# Patient Record
Sex: Female | Born: 1990 | Race: Black or African American | Hispanic: No | Marital: Single
Health system: Southern US, Community
[De-identification: ages and names within clinical notes are randomized; demographics above are authoritative.]

## PROBLEM LIST (undated history)

## (undated) DIAGNOSIS — Z803 Family history of malignant neoplasm of breast: Secondary | ICD-10-CM

## (undated) HISTORY — DX: Family history of malignant neoplasm of breast: Z80.3

---

## 2018-02-09 DIAGNOSIS — Z01419 Encounter for gynecological examination (general) (routine) without abnormal findings: Secondary | ICD-10-CM | POA: Diagnosis not present

## 2018-02-10 ENCOUNTER — Other Ambulatory Visit: Payer: Self-pay | Admitting: Family

## 2018-02-10 DIAGNOSIS — Z803 Family history of malignant neoplasm of breast: Secondary | ICD-10-CM

## 2018-02-12 ENCOUNTER — Encounter: Payer: Self-pay | Admitting: Genetic Counselor

## 2018-02-12 ENCOUNTER — Telehealth: Payer: Self-pay | Admitting: Genetic Counselor

## 2018-02-12 NOTE — Telephone Encounter (Signed)
Received a call from Germaine PomfretSabrina Scroggins at [facility] to schedule a genetics appt. Pt has been scheduled to see Maylon CosKaren Powell on 10/16 at 2pm. Martie LeeSabrina will notify the pt. Letter mailed.

## 2018-02-24 DIAGNOSIS — F439 Reaction to severe stress, unspecified: Secondary | ICD-10-CM | POA: Diagnosis not present

## 2018-03-10 ENCOUNTER — Encounter: Payer: Self-pay | Admitting: Genetic Counselor

## 2018-03-10 ENCOUNTER — Inpatient Hospital Stay: Payer: BLUE CROSS/BLUE SHIELD | Attending: Genetic Counselor | Admitting: Genetic Counselor

## 2018-03-10 ENCOUNTER — Inpatient Hospital Stay: Payer: BLUE CROSS/BLUE SHIELD

## 2018-03-10 DIAGNOSIS — Z803 Family history of malignant neoplasm of breast: Secondary | ICD-10-CM

## 2018-03-10 DIAGNOSIS — Z808 Family history of malignant neoplasm of other organs or systems: Secondary | ICD-10-CM | POA: Diagnosis not present

## 2018-03-10 NOTE — Progress Notes (Signed)
REFERRING PROVIDER: Willey Blade, MD [place], [place] [address]  PRIMARY PROVIDER:  Patient, No Pcp Per  PRIMARY REASON FOR VISIT:  1. Family history of breast cancer   2. Family history of bone cancer      HISTORY OF PRESENT ILLNESS:   Kimberly Sweeney, a 27 y.o. female, was seen for a [place] cancer genetics consultation at the request of Dr. Karlton Lemon due to a personal and family history of cancer.  Kimberly Sweeney presents to clinic today to discuss the possibility of a hereditary predisposition to cancer, genetic testing, and to further clarify her future cancer risks, as well as potential cancer risks for family members.   Kimberly Sweeney is a 27 y.o. female with no personal history of cancer.  She has a family history of breast cancer and was referred for genetic testing.  The patient reports that her mother and maternal aunt both had testing and she thinks both had negative genetic test results.  CANCER HISTORY:   No history exists.     HORMONAL RISK FACTORS:  Menarche was at age 76-12.  First live birth at age N/A.  OCP use for approximately 0 years.  Ovaries intact: yes.  Hysterectomy: no.  Menopausal status: premenopausal.  HRT use: 0 years. Colonoscopy: no; not examined. Mammogram within the last year: upcoming mammogram 03/12/2018. Number of breast biopsies: 0. Up to date with pelvic exams:  yes. Any excessive radiation exposure in the past:  no  Past Medical History:  Diagnosis Date  . Family history of breast cancer     Social History   Socioeconomic History  . Marital status: Single    Spouse name: Not on file  . Number of children: Not on file  . Years of education: Not on file  . Highest education level: Not on file  Occupational History  . Not on file  Social Needs  . Financial resource strain: Not on file  . Food insecurity:    Worry: Not on file    Inability: Not on file  . Transportation needs:    Medical: Not on file   Non-medical: Not on file  Tobacco Use  . Smoking status: Not on file  Substance and Sexual Activity  . Alcohol use: Not on file  . Drug use: Not on file  . Sexual activity: Not on file  Lifestyle  . Physical activity:    Days per week: Not on file    Minutes per session: Not on file  . Stress: Not on file  Relationships  . Social connections:    Talks on phone: Not on file    Gets together: Not on file    Attends religious service: Not on file    Active member of club or organization: Not on file    Attends meetings of clubs or organizations: Not on file    Relationship status: Not on file  Other Topics Concern  . Not on file  Social History Narrative  . Not on file     FAMILY HISTORY:  We obtained a detailed, 4-generation family history.  Significant diagnoses are listed below: Family History  Problem Relation Age of Onset  . Breast cancer Mother 3       neg genetic testing after 2013  . Breast cancer Maternal Grandmother        dx in her 84s  . Bone cancer Maternal Grandfather   . Kidney failure Maternal Grandfather   . Heart disease Paternal Grandmother   .  Breast cancer Maternal Aunt        dx in her 31s  . Sickle cell anemia Maternal Uncle     The patient does not have children.  She has two paternal half sisters and a maternal half sister.  All are cancer free.  The patient's parents are both living.  The patient's mother had breast cancer at age 32.  She had genetic testing since 2013 which is reportedly negative.  She had two sisters and three brothers.  One sister had breast cancer in her 75's and also had reportedly negative genetic testing.  One brother died in his 71's from sickle cell anemia.  The maternal grandfather died of bone cancer and the grandmother is living in her 52's.  She had breast cancer in her 13's.  The patient's father had 12 siblings, none were reported to have cancer.  The paternal grandparents are deceased from non cancer related  issues.  Kimberly Sweeney is aware of previous family history of genetic testing for hereditary cancer risks. Patient's maternal ancestors are of African American descent, and paternal ancestors are of African American descent. There is no reported Ashkenazi Jewish ancestry. There is no known consanguinity.  GENETIC COUNSELING ASSESSMENT: Kimberly Sweeney is a 27 y.o. female with a family history of breast cancer which is somewhat suggestive of a hereditary breast cancer syndrome and predisposition to cancer. We, therefore, discussed and recommended the following at today's visit.   DISCUSSION: We discussed that about 5-10% of breast cancer is hereditary with most cases due to BRCA mutations.  There are other genes that can be associated with hereditary breast cancer syndromes including ATM, CHEK2 and PALB2.  Depending on when her mother was tested and what kind of testing she had would determine whether her mother would need to be tested again.  For instance if her mother was only tested for BRCA mutations, but did not have a panel that looked at other hereditary genes, some of her risk could have been missed by the limitations of testing at that time.    We reviewed the characteristics, features and inheritance patterns of hereditary cancer syndromes. We also discussed genetic testing, including the appropriate family members to test, the process of testing, insurance coverage and turn-around-time for results. We discussed the implications of a negative, positive and/or variant of uncertain significant result. We recommended Kimberly Sweeney pursue genetic testing for the common hereditary gene panel. The Hereditary Gene Panel offered by [facility] includes sequencing and/or deletion duplication testing of the following 47 genes: APC, ATM, AXIN2, BARD1, BMPR1A, BRCA1, BRCA2, BRIP1, CDH1, CDK4, CDKN2A (p14ARF), CDKN2A (p16INK4a), CHEK2, CTNNA1, DICER1, EPCAM (Deletion/duplication testing only), GREM1 (promoter region  deletion/duplication testing only), KIT, MEN1, MLH1, MSH2, MSH3, MSH6, MUTYH, NBN, NF1, NHTL1, PALB2, PDGFRA, PMS2, POLD1, POLE, PTEN, RAD50, RAD51C, RAD51D, SDHB, SDHC, SDHD, SMAD4, SMARCA4. STK11, TP53, TSC1, TSC2, and VHL.  The following genes were evaluated for sequence changes only: SDHA and HOXB13 c.251G>A variant only.   Based on Kimberly Sweeney's personal and family history of cancer, she meets medical criteria for genetic testing. Despite that she meets criteria, she may still have an out of pocket cost. We discussed that if her out of pocket cost for testing is over $100, the laboratory will call and confirm whether she wants to proceed with testing.  If the out of pocket cost of testing is less than $100 she will be billed by the genetic testing laboratory.   Based on the patient's personal and family history,  statistical models (Tyrer Cusik)  and literature data were used to estimate her risk of developing breast cancer. These estimate her lifetime risk of developing breast cancer to be approximately 41.9%. This estimation does not take into account any genetic testing results.  The patient's lifetime breast cancer risk is a preliminary estimate based on available information using one of several models endorsed by the [place] (ACS). The ACS recommends consideration of breast MRI screening as an adjunct to mammography for patients at high risk (defined as 20% or greater lifetime risk). A more detailed breast cancer risk assessment can be considered, if clinically indicated.   Kimberly Sweeney has been determined to be at high risk for breast cancer.  Therefore, we recommend that annual screening with mammography and breast MRI begin at age 25, or 10 years prior to the age of breast cancer diagnosis in a relative (whichever is earlier).  We discussed that Kimberly Sweeney should discuss her individual situation with her referring physician and determine a breast cancer screening plan with which they  are both comfortable.     PLAN: After considering the risks, benefits, and limitations, Kimberly Sweeney  provided informed consent to pursue genetic testing and the blood sample was sent to [facility] for analysis of the common hereditary cancer panel. Results should be available within approximately 2-3 weeks' time, at which point they will be disclosed by telephone to Kimberly Sweeney, as will any additional recommendations warranted by these results. Kimberly Sweeney will receive a summary of her genetic counseling visit and a copy of her results once available. This information will also be available in Epic. We encouraged Kimberly Sweeney to remain in contact with cancer genetics annually so that we can continuously update the family history and inform her of any changes in cancer genetics and testing that may be of benefit for her family. Kimberly Sweeney questions were answered to her satisfaction today. Our contact information was provided should additional questions or concerns arise.  Lastly, we encouraged Kimberly Sweeney to remain in contact with cancer genetics annually so that we can continuously update the family history and inform her of any changes in cancer genetics and testing that may be of benefit for this family.   Ms.  Sweeney questions were answered to her satisfaction today. Our contact information was provided should additional questions or concerns arise. Thank you for the referral and allowing [place] to share in the care of your patient.   Kimberly Sweeney, [place], [facility] Certified Genetic Counselor [email or web address] phone: [phone]  The patient was seen for a total of 45 minutes in face-to-face genetic counseling.  This patient was discussed with Drs. Magrinat, Lindi Adie and/or Burr Medico who agrees with the above.    _______________________________________________________________________ For Office Staff:  Number of people involved in session: 1 Was an Intern/ student involved with case:  no

## 2018-03-12 ENCOUNTER — Ambulatory Visit
Admission: RE | Admit: 2018-03-12 | Discharge: 2018-03-12 | Disposition: A | Discharge status: Home / Self Care | Payer: BLUE CROSS/BLUE SHIELD | Source: Ambulatory Visit | Attending: Family | Admitting: Family

## 2018-03-12 ENCOUNTER — Other Ambulatory Visit: Payer: Self-pay | Admitting: Family

## 2018-03-12 DIAGNOSIS — Z1231 Encounter for screening mammogram for malignant neoplasm of breast: Secondary | ICD-10-CM | POA: Diagnosis not present

## 2018-03-12 DIAGNOSIS — Z803 Family history of malignant neoplasm of breast: Secondary | ICD-10-CM

## 2018-03-16 ENCOUNTER — Other Ambulatory Visit: Payer: Self-pay | Admitting: Family

## 2018-03-16 DIAGNOSIS — R928 Other abnormal and inconclusive findings on diagnostic imaging of breast: Secondary | ICD-10-CM

## 2018-03-18 ENCOUNTER — Other Ambulatory Visit: Payer: Self-pay | Admitting: Family

## 2018-03-18 ENCOUNTER — Ambulatory Visit
Admission: RE | Admit: 2018-03-18 | Discharge: 2018-03-18 | Disposition: A | Discharge status: Home / Self Care | Payer: BLUE CROSS/BLUE SHIELD | Source: Ambulatory Visit | Attending: Family | Admitting: Family

## 2018-03-18 DIAGNOSIS — N6489 Other specified disorders of breast: Secondary | ICD-10-CM | POA: Diagnosis not present

## 2018-03-18 DIAGNOSIS — Z809 Family history of malignant neoplasm, unspecified: Secondary | ICD-10-CM | POA: Diagnosis not present

## 2018-03-18 DIAGNOSIS — R922 Inconclusive mammogram: Secondary | ICD-10-CM | POA: Diagnosis not present

## 2018-03-18 DIAGNOSIS — R928 Other abnormal and inconclusive findings on diagnostic imaging of breast: Secondary | ICD-10-CM

## 2018-03-18 DIAGNOSIS — N63 Unspecified lump in unspecified breast: Secondary | ICD-10-CM

## 2018-03-25 ENCOUNTER — Telehealth: Payer: Self-pay | Admitting: Genetic Counselor

## 2018-03-25 ENCOUNTER — Ambulatory Visit: Payer: Self-pay | Admitting: Genetic Counselor

## 2018-03-25 ENCOUNTER — Encounter: Payer: Self-pay | Admitting: Genetic Counselor

## 2018-03-25 DIAGNOSIS — Z1379 Encounter for other screening for genetic and chromosomal anomalies: Secondary | ICD-10-CM | POA: Insufficient documentation

## 2018-03-25 NOTE — Progress Notes (Addendum)
HPI:  Kimberly Sweeney was previously seen in the [place] clinic due to a family history of cancer and concerns regarding a hereditary predisposition to cancer. Please refer to our prior cancer genetics clinic note for more information regarding Kimberly Sweeney's medical, social and family histories, and our assessment and recommendations, at the time. Kimberly Sweeney recent genetic test results were disclosed to her, as were recommendations warranted by these results. These results and recommendations are discussed in more detail below.  CANCER HISTORY:   No history exists.    FAMILY HISTORY:  We obtained a detailed, 4-generation family history.  Significant diagnoses are listed below: Family History  Problem Relation Age of Onset  . Breast cancer Mother 53       neg genetic testing after 2013  . Breast cancer Maternal Grandmother        dx in her 40s  . Bone cancer Maternal Grandfather   . Kidney failure Maternal Grandfather   . Heart disease Paternal Grandmother   . Breast cancer Maternal Aunt        dx in her 55s  . Sickle cell anemia Maternal Uncle     The patient does not have children.  She has two paternal half sisters and a maternal half sister.  All are cancer free.  The patient's parents are both living.  The patient's mother had breast cancer at age 71.  She had genetic testing since 2013 which is reportedly negative.  She had two sisters and three brothers.  One sister had breast cancer in her 16's and also had reportedly negative genetic testing.  One brother died in his 51's from sickle cell anemia.  The maternal grandfather died of bone cancer and the grandmother is living in her 24's.  She had breast cancer in her 56's.  The patient's father had 12 siblings, none were reported to have cancer.  The paternal grandparents are deceased from non cancer related issues.  Kimberly Sweeney is aware of previous family history of genetic testing for hereditary cancer risks.  Patient's maternal ancestors are of African American descent, and paternal ancestors are of African American descent. There is no reported Ashkenazi Jewish ancestry. There is no known consanguinity.  GENETIC TEST RESULTS: Genetic testing reported out on March 23, 2018 through the common hereditary cancer panel found no deleterious mutations.  The Hereditary Gene Panel offered by [facility] includes sequencing and/or deletion duplication testing of the following 47 genes: APC, ATM, AXIN2, BARD1, BMPR1A, BRCA1, BRCA2, BRIP1, CDH1, CDK4, CDKN2A (p14ARF), CDKN2A (p16INK4a), CHEK2, CTNNA1, DICER1, EPCAM (Deletion/duplication testing only), GREM1 (promoter region deletion/duplication testing only), KIT, MEN1, MLH1, MSH2, MSH3, MSH6, MUTYH, NBN, NF1, NHTL1, PALB2, PDGFRA, PMS2, POLD1, POLE, PTEN, RAD50, RAD51C, RAD51D, SDHB, SDHC, SDHD, SMAD4, SMARCA4. STK11, TP53, TSC1, TSC2, and VHL.  The following genes were evaluated for sequence changes only: SDHA and HOXB13 c.251G>A variant only.  The test report has been scanned into EPIC and is located under the Molecular Pathology section of the Results Review tab.    We discussed with Kimberly Sweeney that since the current genetic testing is not perfect, it is possible there may be a gene mutation in one of these genes that current testing cannot detect, but that chance is small.  We also discussed, that it is possible that another gene that has not yet been discovered, or that we have not yet tested, is responsible for the cancer diagnoses in the family, and it is, therefore, important to remain in touch  with cancer genetics in the future so that we can continue to offer Kimberly Sweeney the most up to date genetic testing.   CANCER SCREENING RECOMMENDATIONS: Given Kimberly Sweeney's personal and family histories, we must interpret these negative results with some caution.  Families with features suggestive of hereditary risk for cancer tend to have multiple family members with cancer,  diagnoses in multiple generations and diagnoses before the age of 28. Kimberly Sweeney family exhibits some of these features. Thus this result may simply reflect our current inability to detect all mutations within these genes or there may be a different gene that has not yet been discovered or tested.   An individual's cancer risk and medical management are not determined by genetic test results alone. Overall cancer risk assessment incorporates additional factors, including personal medical history, family history, and any available genetic information that may result in a personalized plan for cancer prevention and surveillance.  Based on Kimberly Sweeney's personal and family history of cancer, as well as her genetic test results, statistical models Harriett Sweeney)  and literature data were used to estimate her risk of developing breast cancer. These estimate her lifetime risk of developing breast cancer to be approximately 35%.  The patient's lifetime breast cancer risk is a preliminary estimate based on available information using one of several models endorsed by the [place] (ACS). The ACS recommends consideration of breast MRI screening as an adjunct to mammography for patients at high risk (defined as 20% or greater lifetime risk). A more detailed breast cancer risk assessment can be considered, if clinically indicated.   Kimberly Sweeney has been determined to be at high risk for breast cancer.  Therefore, we recommend that annual screening with mammography and breast MRI begin at age 26, or 10 years prior to the age of breast cancer diagnosis in a relative (whichever is earlier).  We discussed that Kimberly Sweeney should discuss her individual situation with her referring physician and determine a breast cancer screening plan with which they are both comfortable.    We, therefore, discussed that it is reasonable for Kimberly Sweeney to be followed by a high-risk breast cancer clinic; in addition to a yearly  mammogram and physical exam by a healthcare provider, she should discuss the usefulness of an annual breast MRI with the high-risk clinic providers. She will be referred to the [facility] high risk breast clinic.  RECOMMENDATIONS FOR FAMILY MEMBERS:  Women in this family might be at some increased risk of developing cancer, over the general population risk, simply due to the family history of cancer.  We recommended women in this family have a yearly mammogram beginning at age 62, or 43 years younger than the earliest onset of cancer, an annual clinical breast exam, and perform monthly breast self-exams. Women in this family should also have a gynecological exam as recommended by their primary provider. All family members should have a colonoscopy by age 60.  Based on Kimberly Sweeney's family history, we recommended her mother, who was diagnosed with breast cancer at age 2, have genetic counseling and possible updated testing. Kimberly Sweeney will let us know if we can be of any assistance in coordinating genetic counseling and/or testing for this family member.   FOLLOW-UP: Lastly, we discussed with Kimberly Sweeney that cancer genetics is a rapidly advancing field and it is possible that new genetic tests will be appropriate for her and/or her family members in the future. We encouraged her to remain in contact with cancer  genetics on an annual basis so we can update her personal and family histories and let her know of advances in cancer genetics that may benefit this family.   Our contact number was provided. Kimberly Sweeney questions were answered to her satisfaction, and she knows she is welcome to call us at anytime with additional questions or concerns.   Roma Kayser, MS, [facility] Certified Genetic Counselor [email or web address]

## 2018-03-25 NOTE — Telephone Encounter (Signed)
Revealed negative genetic testing.  Discussed that we do not know why there is cancer in the family. It could be due to a different gene that we are not testing, or maybe our current technology may not be able to pick something up.  It will be important for her to keep in contact with genetics to keep up with whether additional testing may be needed.  pateint is at high risk for breast cancer.  TC gives 35% risk.  Referred to high risk breast clinic for follow up.

## 2018-04-01 DIAGNOSIS — M25531 Pain in right wrist: Secondary | ICD-10-CM | POA: Diagnosis not present

## 2018-04-15 ENCOUNTER — Telehealth: Payer: Self-pay | Admitting: Oncology

## 2018-04-15 NOTE — Telephone Encounter (Signed)
Lft the pt a vm to schedule an appt for the high risk clinic

## 2018-04-29 ENCOUNTER — Other Ambulatory Visit: Payer: Self-pay | Admitting: Family

## 2018-07-08 DIAGNOSIS — F439 Reaction to severe stress, unspecified: Secondary | ICD-10-CM | POA: Diagnosis not present

## 2018-08-05 DIAGNOSIS — J00 Acute nasopharyngitis [common cold]: Secondary | ICD-10-CM | POA: Diagnosis not present

## 2018-09-20 ENCOUNTER — Other Ambulatory Visit: Payer: Self-pay

## 2018-09-20 ENCOUNTER — Ambulatory Visit
Admission: RE | Admit: 2018-09-20 | Discharge: 2018-09-20 | Disposition: A | Discharge status: Home / Self Care | Payer: BLUE CROSS/BLUE SHIELD | Source: Ambulatory Visit | Attending: Family | Admitting: Family

## 2018-09-20 ENCOUNTER — Other Ambulatory Visit: Payer: Self-pay | Admitting: Family

## 2018-09-20 DIAGNOSIS — N63 Unspecified lump in unspecified breast: Secondary | ICD-10-CM | POA: Diagnosis not present

## 2018-09-20 DIAGNOSIS — N6489 Other specified disorders of breast: Secondary | ICD-10-CM | POA: Diagnosis not present

## 2019-02-18 DIAGNOSIS — Z1159 Encounter for screening for other viral diseases: Secondary | ICD-10-CM | POA: Diagnosis not present

## 2019-02-18 DIAGNOSIS — Z20828 Contact with and (suspected) exposure to other viral communicable diseases: Secondary | ICD-10-CM | POA: Diagnosis not present

## 2019-02-18 DIAGNOSIS — U071 COVID-19: Secondary | ICD-10-CM | POA: Diagnosis not present

## 2019-02-20 DIAGNOSIS — J111 Influenza due to unidentified influenza virus with other respiratory manifestations: Secondary | ICD-10-CM | POA: Diagnosis not present

## 2019-02-20 DIAGNOSIS — U071 COVID-19: Secondary | ICD-10-CM | POA: Diagnosis not present

## 2019-03-07 DIAGNOSIS — Z1159 Encounter for screening for other viral diseases: Secondary | ICD-10-CM | POA: Diagnosis not present

## 2019-03-14 ENCOUNTER — Ambulatory Visit
Admission: RE | Admit: 2019-03-14 | Discharge: 2019-03-14 | Disposition: A | Discharge status: Home / Self Care | Payer: BLUE CROSS/BLUE SHIELD | Source: Ambulatory Visit | Attending: Family | Admitting: Family

## 2019-03-14 ENCOUNTER — Other Ambulatory Visit: Payer: Self-pay

## 2019-03-14 ENCOUNTER — Ambulatory Visit
Admission: RE | Admit: 2019-03-14 | Discharge: 2019-03-14 | Disposition: A | Discharge status: Home / Self Care | Payer: BC Managed Care – PPO | Source: Ambulatory Visit | Attending: Family | Admitting: Family

## 2019-03-14 DIAGNOSIS — R922 Inconclusive mammogram: Secondary | ICD-10-CM | POA: Diagnosis not present

## 2019-03-14 DIAGNOSIS — N63 Unspecified lump in unspecified breast: Secondary | ICD-10-CM

## 2019-03-22 ENCOUNTER — Other Ambulatory Visit: Payer: BLUE CROSS/BLUE SHIELD

## 2019-04-18 DIAGNOSIS — Z113 Encounter for screening for infections with a predominantly sexual mode of transmission: Secondary | ICD-10-CM | POA: Diagnosis not present

## 2019-04-18 DIAGNOSIS — N760 Acute vaginitis: Secondary | ICD-10-CM | POA: Diagnosis not present

## 2019-04-18 DIAGNOSIS — Z20828 Contact with and (suspected) exposure to other viral communicable diseases: Secondary | ICD-10-CM | POA: Diagnosis not present

## 2019-04-18 DIAGNOSIS — B373 Candidiasis of vulva and vagina: Secondary | ICD-10-CM | POA: Diagnosis not present

## 2019-06-22 DIAGNOSIS — Z20828 Contact with and (suspected) exposure to other viral communicable diseases: Secondary | ICD-10-CM | POA: Diagnosis not present

## 2019-07-04 DIAGNOSIS — R825 Elevated urine levels of drugs, medicaments and biological substances: Secondary | ICD-10-CM | POA: Diagnosis not present

## 2019-07-04 DIAGNOSIS — Z20828 Contact with and (suspected) exposure to other viral communicable diseases: Secondary | ICD-10-CM | POA: Diagnosis not present

## 2019-07-15 DIAGNOSIS — Z20828 Contact with and (suspected) exposure to other viral communicable diseases: Secondary | ICD-10-CM | POA: Diagnosis not present

## 2019-07-26 DIAGNOSIS — Z20828 Contact with and (suspected) exposure to other viral communicable diseases: Secondary | ICD-10-CM | POA: Diagnosis not present

## 2019-08-12 DIAGNOSIS — N760 Acute vaginitis: Secondary | ICD-10-CM | POA: Diagnosis not present

## 2019-08-12 DIAGNOSIS — Z20822 Contact with and (suspected) exposure to covid-19: Secondary | ICD-10-CM | POA: Diagnosis not present

## 2019-08-12 DIAGNOSIS — B373 Candidiasis of vulva and vagina: Secondary | ICD-10-CM | POA: Diagnosis not present

## 2019-09-01 ENCOUNTER — Ambulatory Visit: Payer: BC Managed Care – PPO | Attending: Family

## 2019-09-01 DIAGNOSIS — Z23 Encounter for immunization: Secondary | ICD-10-CM

## 2019-09-01 NOTE — Progress Notes (Signed)
   Covid-19 Vaccination Clinic  Name:  Kimberly Sweeney    MRN: [id number] DOB: 05-Jul-1990  09/01/2019  Kimberly Sweeney was observed post Covid-19 immunization for 15 minutes without incident. She was provided with Vaccine Information Sheet and instruction to access the V-Safe system.   Kimberly Sweeney was instructed to call 911 with any severe reactions post vaccine: Marland Kitchen Difficulty breathing  . Swelling of face and throat  . A fast heartbeat  . A bad rash all over body  . Dizziness and weakness   Immunizations Administered    Name Date Dose VIS Date Route   Moderna COVID-19 Vaccine 09/01/2019 11:52 AM 0.5 mL 04/26/2019 Intramuscular   Manufacturer: Moderna   Lot: [id number]   NDC: 54360-677-03

## 2019-09-27 ENCOUNTER — Ambulatory Visit: Payer: BC Managed Care – PPO | Attending: Family

## 2019-09-27 DIAGNOSIS — N760 Acute vaginitis: Secondary | ICD-10-CM | POA: Diagnosis not present

## 2019-09-27 DIAGNOSIS — Z23 Encounter for immunization: Secondary | ICD-10-CM

## 2019-09-27 NOTE — Progress Notes (Signed)
   Covid-19 Vaccination Clinic  Name:  Kimberly Sweeney    MRN: [id number] DOB: 1990-10-16  09/27/2019  [name] was observed post Covid-19 immunization for 15 minutes without incident. She was provided with Vaccine Information Sheet and instruction to access the V-Safe system.   [name] was instructed to call 911 with any severe reactions post vaccine: Marland Kitchen Difficulty breathing  . Swelling of face and throat  . A fast heartbeat  . A bad rash all over body  . Dizziness and weakness   Immunizations Administered    Name Date Dose VIS Date Route   Moderna COVID-19 Vaccine 09/27/2019  2:17 PM 0.5 mL 04/2019 Intramuscular   Manufacturer: Moderna   Lot: [id number]   NDC: 18984-210-31

## 2019-10-17 DIAGNOSIS — Z113 Encounter for screening for infections with a predominantly sexual mode of transmission: Secondary | ICD-10-CM | POA: Diagnosis not present

## 2019-11-01 DIAGNOSIS — N899 Noninflammatory disorder of vagina, unspecified: Secondary | ICD-10-CM | POA: Diagnosis not present

## 2019-11-02 DIAGNOSIS — F439 Reaction to severe stress, unspecified: Secondary | ICD-10-CM | POA: Diagnosis not present

## 2019-11-23 DIAGNOSIS — F439 Reaction to severe stress, unspecified: Secondary | ICD-10-CM | POA: Diagnosis not present

## 2019-11-30 DIAGNOSIS — N760 Acute vaginitis: Secondary | ICD-10-CM | POA: Diagnosis not present

## 2019-11-30 DIAGNOSIS — N9489 Other specified conditions associated with female genital organs and menstrual cycle: Secondary | ICD-10-CM | POA: Diagnosis not present

## 2020-03-02 ENCOUNTER — Other Ambulatory Visit: Payer: Self-pay | Admitting: *Deleted

## 2020-03-02 DIAGNOSIS — Z803 Family history of malignant neoplasm of breast: Secondary | ICD-10-CM

## 2020-05-15 ENCOUNTER — Other Ambulatory Visit: Payer: Self-pay | Admitting: Obstetrics and Gynecology

## 2020-05-15 DIAGNOSIS — Z803 Family history of malignant neoplasm of breast: Secondary | ICD-10-CM

## 2020-06-22 ENCOUNTER — Ambulatory Visit
Admission: RE | Admit: 2020-06-22 | Discharge: 2020-06-22 | Disposition: A | Discharge status: Home / Self Care | Payer: BC Managed Care – PPO | Source: Ambulatory Visit | Attending: Obstetrics and Gynecology | Admitting: Obstetrics and Gynecology

## 2020-06-22 ENCOUNTER — Other Ambulatory Visit: Payer: Self-pay

## 2020-06-22 DIAGNOSIS — Z803 Family history of malignant neoplasm of breast: Secondary | ICD-10-CM

## 2020-06-28 ENCOUNTER — Ambulatory Visit: Payer: Self-pay

## 2021-07-17 IMAGING — MG DIGITAL DIAGNOSTIC BILAT W/ TOMO W/ CAD
8 series · 8 of 24 positions shown · non-contrast
Comparison: Previous exam(s).

CLINICAL DATA: One year follow-up of a probable benign asymmetry in
the right breast with no sonographic correlate. Patient has a strong
family history of breast cancer. The patient's mother was diagnosed
with breast cancer at the age of 38.

EXAM:
DIGITAL DIAGNOSTIC BILATERAL MAMMOGRAM WITH CAD AND TOMO

[R CC synth-2D]
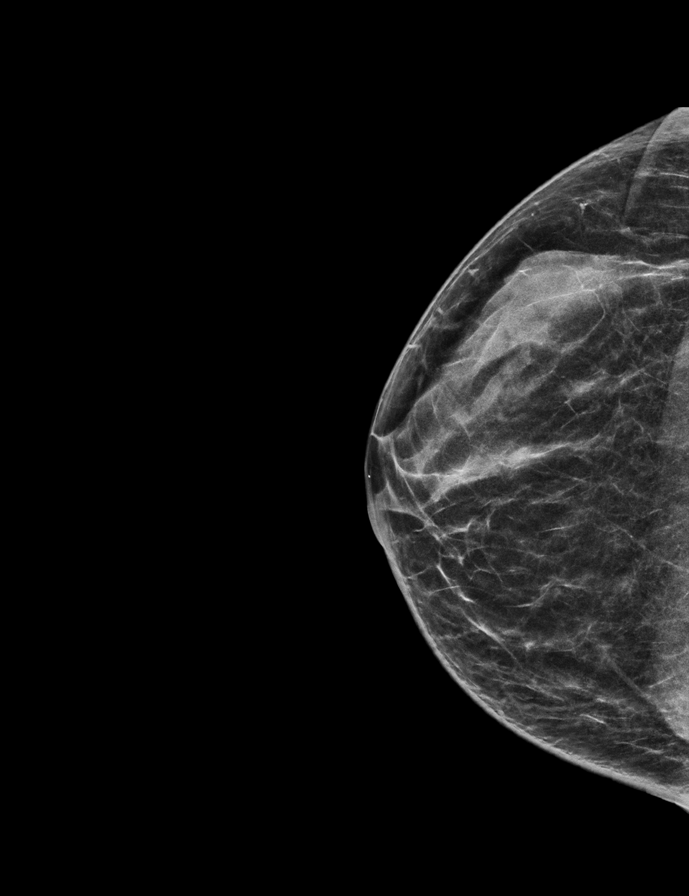

[R MLO synth-2D]
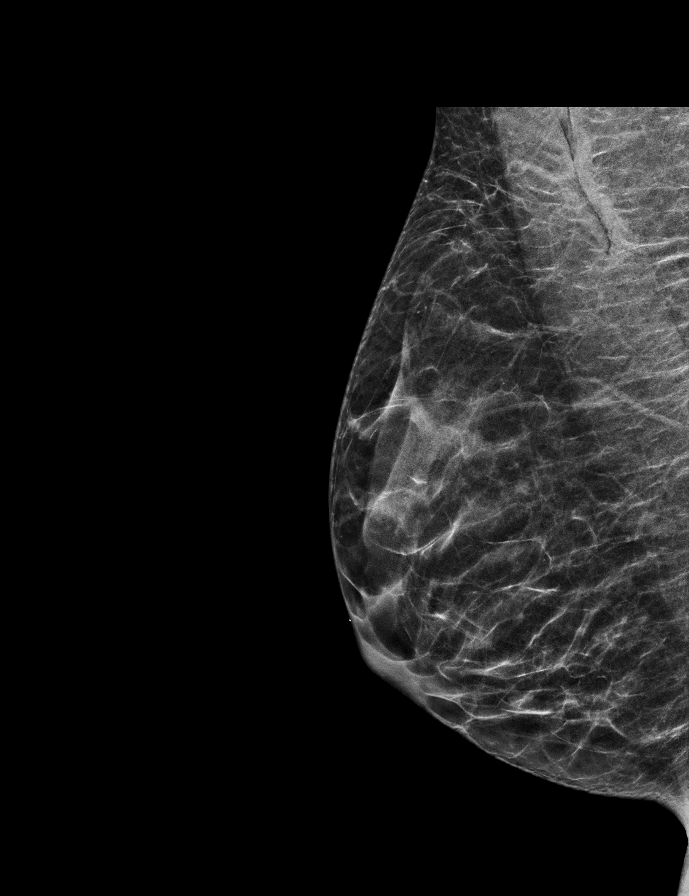

[L MLO synth-2D]
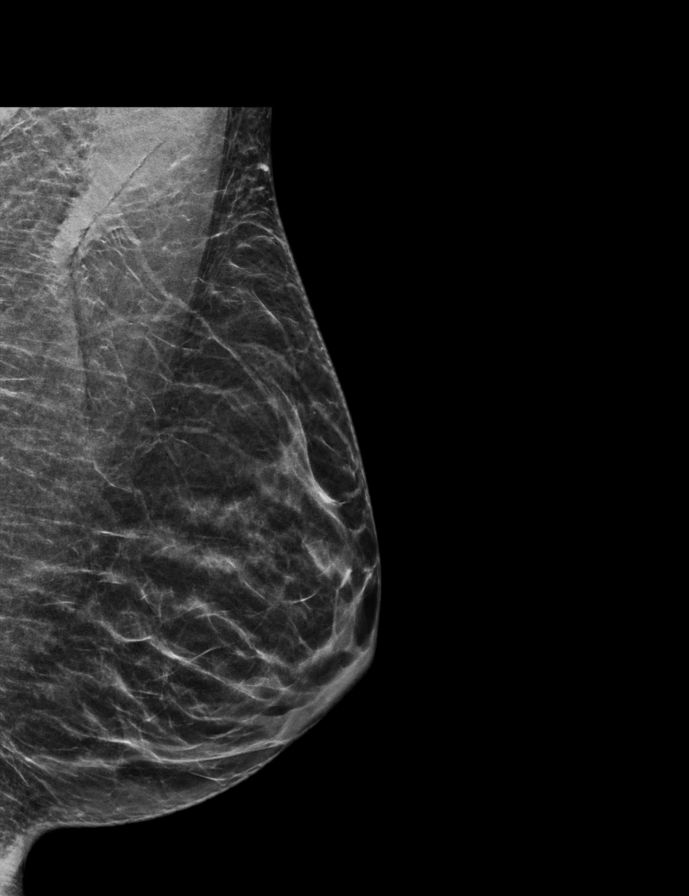

[L CC synth-2D]
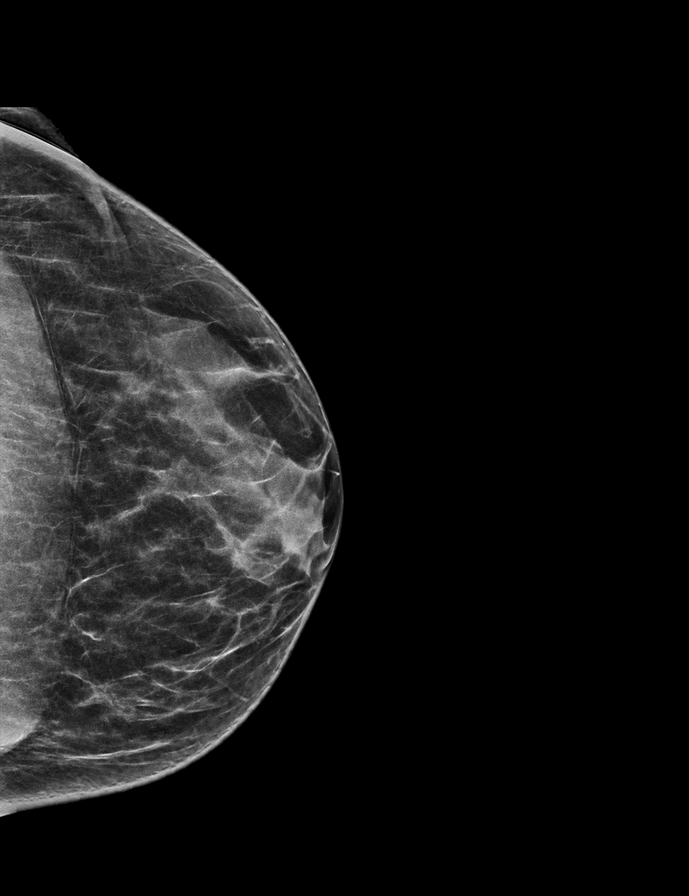

[R CC tomo · tomo slice 25/49.0]
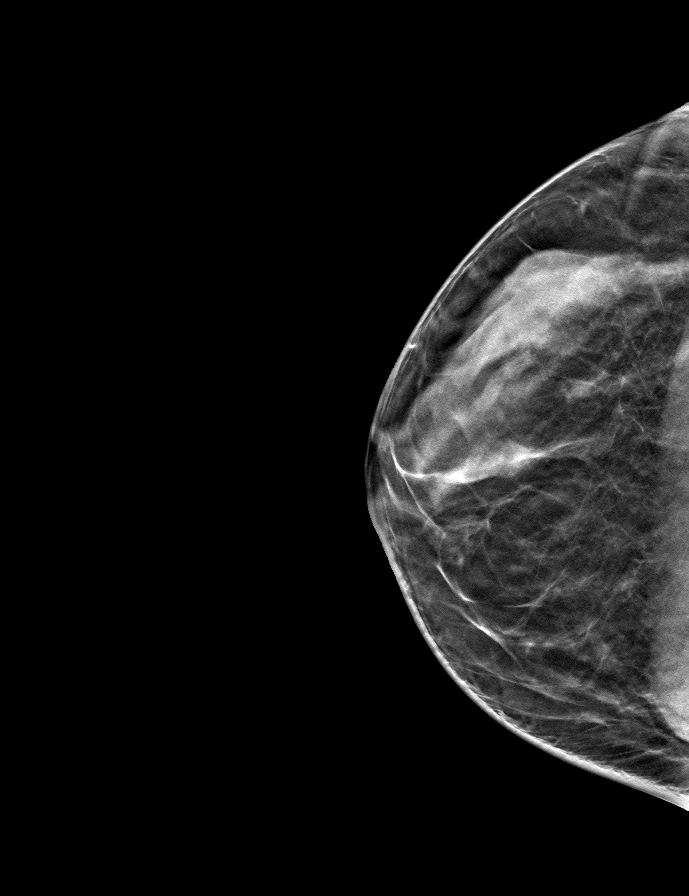

[R MLO tomo · tomo slice 23/46.0]
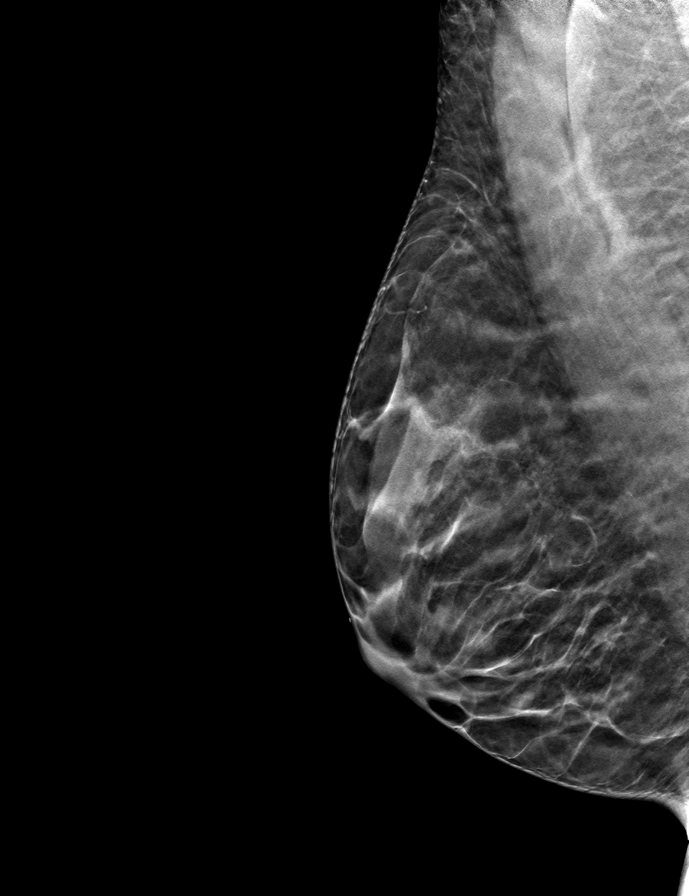

[L CC tomo · tomo slice 26/51.0]
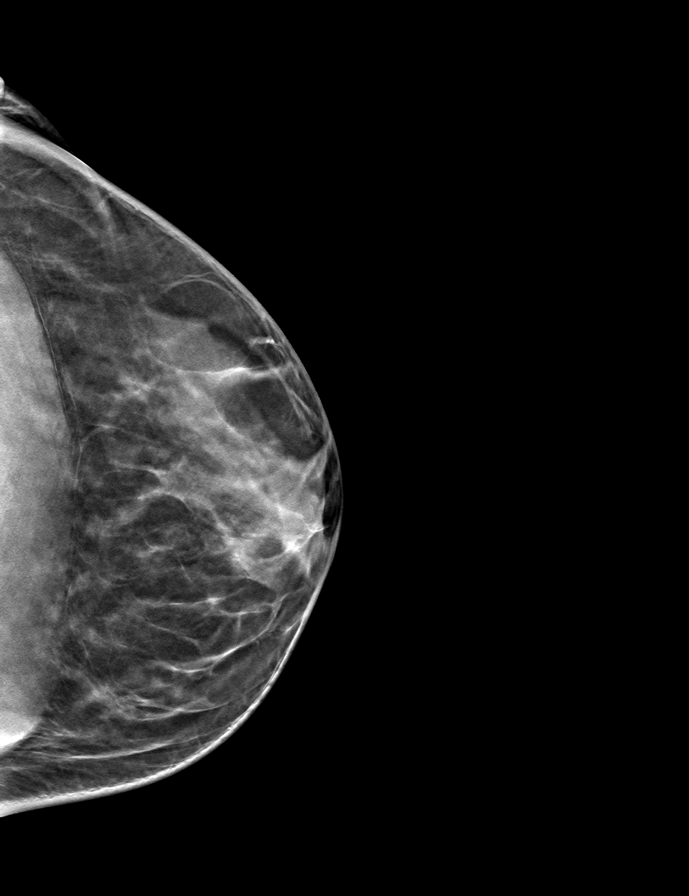

[L MLO tomo · tomo slice 23/46.0]
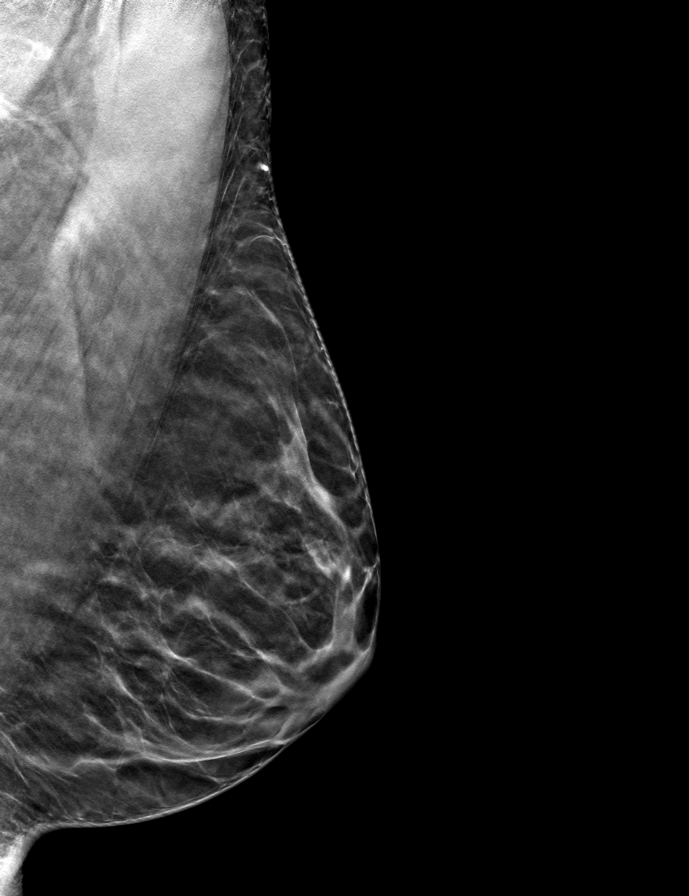

[8 of 24 positions shown; findings below may reference images not displayed]

ACR Breast Density Category c: The breast tissue is heterogeneously
dense, which may obscure small masses.
FINDINGS: Asymmetry in the lateral aspect of the right breast is stable
compared to the prior exams. No suspicious mass or malignant type
microcalcifications identified in either breast.

Mammographic images were processed with CAD.
IMPRESSION: Stable probable benign asymmetry in the right breast.

RECOMMENDATION:
Bilateral diagnostic mammogram in 1 year is recommended to document
stability of the probable benign asymmetry in the right breast for 2
years.

The American Cancer Society recommends annual MRI and mammography in
patients with an estimated lifetime risk of developing breast cancer
greater than 20 - 25%, or who are known or suspected to be positive
for the breast cancer gene.

I have discussed the findings and recommendations with the patient.
If applicable, a reminder letter will be sent to the patient
regarding the next appointment.

BI-RADS CATEGORY  3: Probably benign.

## 2021-07-23 ENCOUNTER — Other Ambulatory Visit: Payer: Self-pay | Admitting: Obstetrics and Gynecology

## 2021-07-23 DIAGNOSIS — Z1231 Encounter for screening mammogram for malignant neoplasm of breast: Secondary | ICD-10-CM

## 2021-11-28 ENCOUNTER — Ambulatory Visit
Admission: RE | Admit: 2021-11-28 | Discharge: 2021-11-28 | Disposition: A | Discharge status: Home / Self Care | Payer: BC Managed Care – PPO | Source: Ambulatory Visit | Attending: Obstetrics and Gynecology | Admitting: Obstetrics and Gynecology

## 2021-11-28 DIAGNOSIS — Z1231 Encounter for screening mammogram for malignant neoplasm of breast: Secondary | ICD-10-CM

## 2022-09-26 ENCOUNTER — Other Ambulatory Visit: Payer: Self-pay

## 2022-09-26 ENCOUNTER — Other Ambulatory Visit: Payer: Self-pay | Admitting: Obstetrics and Gynecology

## 2022-09-26 DIAGNOSIS — N6310 Unspecified lump in the right breast, unspecified quadrant: Secondary | ICD-10-CM

## 2022-09-26 DIAGNOSIS — N644 Mastodynia: Secondary | ICD-10-CM

## 2022-09-30 ENCOUNTER — Ambulatory Visit
Admission: RE | Admit: 2022-09-30 | Discharge: 2022-09-30 | Disposition: A | Discharge status: Home / Self Care | Payer: PRIVATE HEALTH INSURANCE | Source: Ambulatory Visit | Attending: Obstetrics and Gynecology | Admitting: Obstetrics and Gynecology

## 2022-09-30 ENCOUNTER — Ambulatory Visit
Admission: RE | Admit: 2022-09-30 | Discharge: 2022-09-30 | Disposition: A | Discharge status: Home / Self Care | Payer: BC Managed Care – PPO | Source: Ambulatory Visit | Attending: Obstetrics and Gynecology | Admitting: Obstetrics and Gynecology

## 2022-09-30 DIAGNOSIS — N6310 Unspecified lump in the right breast, unspecified quadrant: Secondary | ICD-10-CM

## 2022-09-30 DIAGNOSIS — N644 Mastodynia: Secondary | ICD-10-CM

## 2022-11-18 ENCOUNTER — Other Ambulatory Visit: Payer: Self-pay | Admitting: Obstetrics and Gynecology

## 2022-11-18 DIAGNOSIS — Z1231 Encounter for screening mammogram for malignant neoplasm of breast: Secondary | ICD-10-CM

## 2022-12-17 ENCOUNTER — Ambulatory Visit
Admission: RE | Admit: 2022-12-17 | Discharge: 2022-12-17 | Disposition: A | Discharge status: Home / Self Care | Payer: BC Managed Care – PPO | Source: Ambulatory Visit | Attending: Obstetrics and Gynecology | Admitting: Obstetrics and Gynecology

## 2022-12-17 DIAGNOSIS — Z1231 Encounter for screening mammogram for malignant neoplasm of breast: Secondary | ICD-10-CM
